# Patient Record
Sex: Male | Born: 1945 | Race: Black or African American | Hispanic: No | Marital: Married
Health system: Southern US, Community
[De-identification: ages and names within clinical notes are randomized; demographics above are authoritative.]

---

## 2009-04-18 ENCOUNTER — Emergency Department: Payer: Self-pay | Admitting: Emergency Medicine

## 2013-07-20 ENCOUNTER — Inpatient Hospital Stay: Payer: Self-pay | Admitting: Internal Medicine

## 2013-07-20 LAB — CBC
HCT: 40.5 % (ref 40.0–52.0)
HGB: 13.7 g/dL (ref 13.0–18.0)
MCH: 32.9 pg (ref 26.0–34.0)
MCHC: 33.7 g/dL (ref 32.0–36.0)
MCV: 98 fL (ref 80–100)
PLATELETS: 437 10*3/uL (ref 150–440)
RBC: 4.15 10*6/uL — ABNORMAL LOW (ref 4.40–5.90)
RDW: 14.6 % — ABNORMAL HIGH (ref 11.5–14.5)
WBC: 13.6 10*3/uL — ABNORMAL HIGH (ref 3.8–10.6)

## 2013-07-20 LAB — BASIC METABOLIC PANEL
ANION GAP: 12 (ref 7–16)
BUN: 13 mg/dL (ref 7–18)
CALCIUM: 8.4 mg/dL — AB (ref 8.5–10.1)
Chloride: 108 mmol/L — ABNORMAL HIGH (ref 98–107)
Co2: 20 mmol/L — ABNORMAL LOW (ref 21–32)
Creatinine: 1.75 mg/dL — ABNORMAL HIGH (ref 0.60–1.30)
EGFR (African American): 46 — ABNORMAL LOW
EGFR (Non-African Amer.): 39 — ABNORMAL LOW
Glucose: 97 mg/dL (ref 65–99)
Osmolality: 279 (ref 275–301)
POTASSIUM: 3.9 mmol/L (ref 3.5–5.1)
SODIUM: 140 mmol/L (ref 136–145)

## 2013-07-20 LAB — PRO B NATRIURETIC PEPTIDE: B-TYPE NATIURETIC PEPTID: 411 pg/mL — AB (ref 0–125)

## 2013-07-20 LAB — TROPONIN I: Troponin-I: <0.02 ng/mL

## 2014-06-20 NOTE — H&P (Signed)
PATIENT NAME:  Jason Mills, Jason Mills MR#:  [id number] DATE OF BIRTH:  Jul 16, 1945  DATE OF ADMISSION:  07/20/2013  REFERRING PHYSICIAN: Dr. Margarita Grizzle.  PRIMARY CARE PHYSICIAN:  nonlocal in [place].   CHIEF COMPLAINT: Short of breath.   HISTORY OF PRESENT ILLNESS: A 69 year old African American gentleman with a past medical history of COPD and hypertension presenting with shortness of breath. Describes acute onset of shortness of breath that occurred about 4 hours prior to arrival at the Emergency Department while he was at a barbecue around a charcoal grill, apparently some of the fumes were agitating his breathing. He subsequently had a cough, followed by shortness of breath, cough, nonproductive. No fevers, chills, or other symptomatology. Denies any chest pain, palpitations, or other symptoms. For shortness of breath, he states that he usually takes an albuterol inhaler; however, he did not have this with him and thus called the EMS and brought to the Emergency Department for further workup and evaluation. On initial arrival, he is saturating 90% on room air as well as noted to be tachypneic. He received 3 breathing treatments. Also placed on supplemental O2 and oxygen saturation has improved. Currently without complaints other than cough.   REVIEW OF SYSTEMS:  CONSTITUTIONAL: Denies fever, chills, fatigue. EYES: Denies blurry, double vision, or eye pain.  HEENT: Denies tinnitus, ear pain, hearing loss.  RESPIRATORY: Positive for cough and shortness of breath as described above. CARDIOVASCULAR: Denies chest pain, palpitations, edema.  GASTROINTESTINAL: Denies nausea, vomiting, diarrhea, or abdominal pain.  GENITOURINARY: Denies dysuria or hematuria.  ENDOCRINE: Denies nocturia or thyroid problems.  HEMATOLOGIC AND LYMPHATIC: Denies easy bruising, bleeding. SKIN: Denies rash or lesions. MUSCULOSKELETAL: Denies pain in neck, back, shoulders, knees, hips, or arthritic symptoms.  NEUROLOGIC: Denies  paralysis or paresthesias.  PSYCHIATRIC: Denies anxiety or depressive symptoms.   Otherwise, full review of systems performed by me is negative.   PAST MEDICAL HISTORY: COPD, hypertension, and BPH.  SOCIAL HISTORY: Positive for tobacco usage as well as occasional alcohol usage. Denies any drug usage.   FAMILY HISTORY: Denies any known cardiovascular or pulmonary problems.   ALLERGIES: No known drug allergies.   HOME MEDICATIONS: He is unsure of the dosing of most of his medications; however, he takes aspirin 81 mg p.o. daily, Flomax 0.4 mg p.o. daily, atorvastatin 1 tab p.o. daily, metoprolol 1 tab p.o. b.i.d., ProAir 90 mcg inhalation 2 puffs 4 times daily as needed for shortness of breath, Spiriva 18 mcg inhalation once daily.   PHYSICAL EXAMINATION:  VITAL SIGNS: Temperature 97.6, heart rate 97, respirations 22, blood pressure 185/99, saturating 89% on room air. Currently saturating 96% on 2 liters supplemental O2. Weight 54.4 kg, BMI 18.3.  GENERAL: Chronically ill-appearing African American gentleman, currently in minimal distress secondary to respiratory status.  HEAD: Normocephalic, atraumatic.  EYES: Pupils equal, round, reactive to light. Extraocular muscles intact. No scleral icterus.  MOUTH: Moist mucous membranes. Dentition intact. No abscess noted.  EARS, NOSE, AND THROAT: Clear without exudates. No external lesions.  NECK: Supple. No thyromegaly. No nodules. No JVD.  PULMONARY: Diminished breath sounds throughout all lung fields. However, no frank wheezes, rales, or rhonchi. No used of accessory muscles. He has good respiratory effort though he is tachypneic at this time.  CHEST:  Nontender on palpation.  CARDIOVASCULAR: S1, S2, regular rate and rhythm. No murmurs, rubs, or gallops. No edema. Pedal pulses 2+ bilaterally.  GASTROINTESTINAL: Soft, nontender, nondistended. No masses. Positive bowel sounds. No hepatosplenomegaly.  MUSCULOSKELETAL: No swelling, clubbing, or  edema.  Range of motion is full in all extremities. NEUROLOGIC: Cranial nerves II-XII intact. No gross neurologic deficits. Sensation intact. Reflexes intact.  SKIN: No ulcerations, lesions, or rashes, or cyanosis. Skin warm and dry. Turgor intact. PSYCHIATRIC: Mood and affect within normal limits. The patient alert and oriented x 3. Insight and judgment intact.   LABORATORY DATA: Chest x-ray performed revealing no acute cardiopulmonary process.   The remainder of laboratory data: His BMP was hemolyzed, so recollection is pending at this time. WBC 13.6, hemoglobin 13.7, platelets clumped and needs to be redrawn.   EKG: Sinus tachycardia.   ASSESSMENT AND PLAN: A 69 year old African American gentleman with a history of chronic obstructive pulmonary disease, presenting with shortness of breath. 1. Chronic obstructive pulmonary disease exacerbation. Provide supplemental oxygen to keep oxygen saturation greater than 90%. DuoNeb therapy q. 4 hours. Solu-Medrol 6 mg IV daily as well as azithromycin.  2. Hypertension. Unknown medications. We will add p.r.n. hydralazine if needed for elevated blood pressure until family returns with actual medication list. 3. Systemic inflammatory response syndrome meeting systemic inflammatory response syndrome criteria by leukocytosis and heart rate on arrival. Most likely secondary to chronic obstructive pulmonary disease exacerbation. Will continue to provide supportive care. No evidence of infection at this time. No indication for antibiotics.  4. Venous thromboembolism prophylaxis with heparin subcutaneous.  CODE STATUS: The patient is a full code.  TIME SPENT: 45 minutes.   ____________________________ Cletis Athensavid K. Hower, MD dkh:lt D: 07/20/2013 22:46:06 ET T: 07/20/2013 23:48:43 ET JOB#: [id number]  cc: Cletis Athensavid K. Hower, MD, <Dictator> DAVID Synetta ShadowK HOWER MD ELECTRONICALLY SIGNED 07/21/2013 20:28

## 2014-06-20 NOTE — Discharge Summary (Signed)
PATIENT NAME:  Jason Mills, Jason Mills MR#:  [id number] DATE OF BIRTH:  1945/06/22  DATE OF ADMISSION:  07/20/2013 DATE OF DISCHARGE:  07/21/2013  DISCHARGE DIAGNOSES:  1. Chronic obstructive pulmonary disease exacerbation.  2. Hypertension.  3. Hyperlipidemia.  4. Benign prostatic hypertrophy .ckd stage3   DISCHARGE MEDICATIONS:  1. Metoprolol 25 mg p.o. b.i.d.  2. Atorvastatin, he does not know the dose.  3. ProAir 90 mcg 2 puffs 4 times daily.  4. Spiriva 18 mcg inhalation daily.  5. Flomax 0.4 mg p.o. daily.  6. Zithromax 500 mg p.o. daily for 3 days.  7. Prednisone 20 mg 2 tablets daily for 2 days, then 1 tablet daily for 2 days and then stop.   CONSULTATIONS: None.   HOSPITAL COURSE: The patient is a 69 year old male patient admitted on the 24th because of trouble breathing. The patient's primary doctor is in [place]. This patient has a history of COPD, uses inhalers on routine basis. He came in because of trouble breathing. According to the history, the patient was at a barbecue at a friend's place and had trouble breathing because of the fumes coming from the grill. At that time, he said he did not have inhalers nearby, and he felt short of breath. So, he did not have any fever, chills and cough, but O2 saturations were found to be 90% on room air as well as he had tachypnea. O2 saturations were 96% on 2 liters and 86% on room air. So, he was admitted for COPD exacerbation. The patient's laboratory data, including chest x-ray, were unremarkable. The patient continued on nebulizers along with steroids and empiric antibiotics. The patient thought to have SIRS because of his COPD attack. The patient felt much better and wanted to go home, so I discharged him home. The patient's O2 saturations also improved, and his O2 saturations are 99% on 2 liters, and we weaned off oxygen. The patient's blood pressure was 167/90 and heart rate 87, temperature 98.6 at the time of discharge. The patient's  lab data showed the patient's chest x-ray was showing no acute cardiopulmonary disease, and WBC was 13.6, hemoglobin 13.7, hematocrit 40.5, platelets 437 on admission. BNP was 411. Electrolytes showed sodium 140, potassium 3.9, chloride 108, bicarbonate 20, BUN 13, creatinine 1.75, glucose 97 on admission. The patient's BNP was 411. On the day of discharge, the patient's physical exam showed lungs were clear, cardiovascular was S1, S2 regular, no murmurs. So, I discharged him home, and I told him that he can follow up with his primary doctor in [place] regarding his kidney function because he may have some chronic kidney disease. He said he will follow up with him. He denied any history of urinary troubles or  kidney problems before. So, the patient advised to follow up with his primary doctor to make sure his kidney function is followed and labs are followed.   TIME SPENT: More than 30 minutes.   ____________________________ Katha HammingSnehalatha Danya Spearman, MD sk:lb D: 07/22/2013 10:18:23 ET T: 07/22/2013 10:48:04 ET JOB#: [id number]  cc: Katha HammingSnehalatha Imraan Wendell, MD, <Dictator> Katha HammingSNEHALATHA Tyriana Helmkamp MD ELECTRONICALLY SIGNED 07/23/2013 21:17

## 2015-09-16 IMAGING — CR DG CHEST 1V PORT
1 series · 1 of 1 positions shown · non-contrast
Comparison: None.

CLINICAL DATA: Shortness of breath.

EXAM:
PORTABLE CHEST - 1 VIEW

[ap]
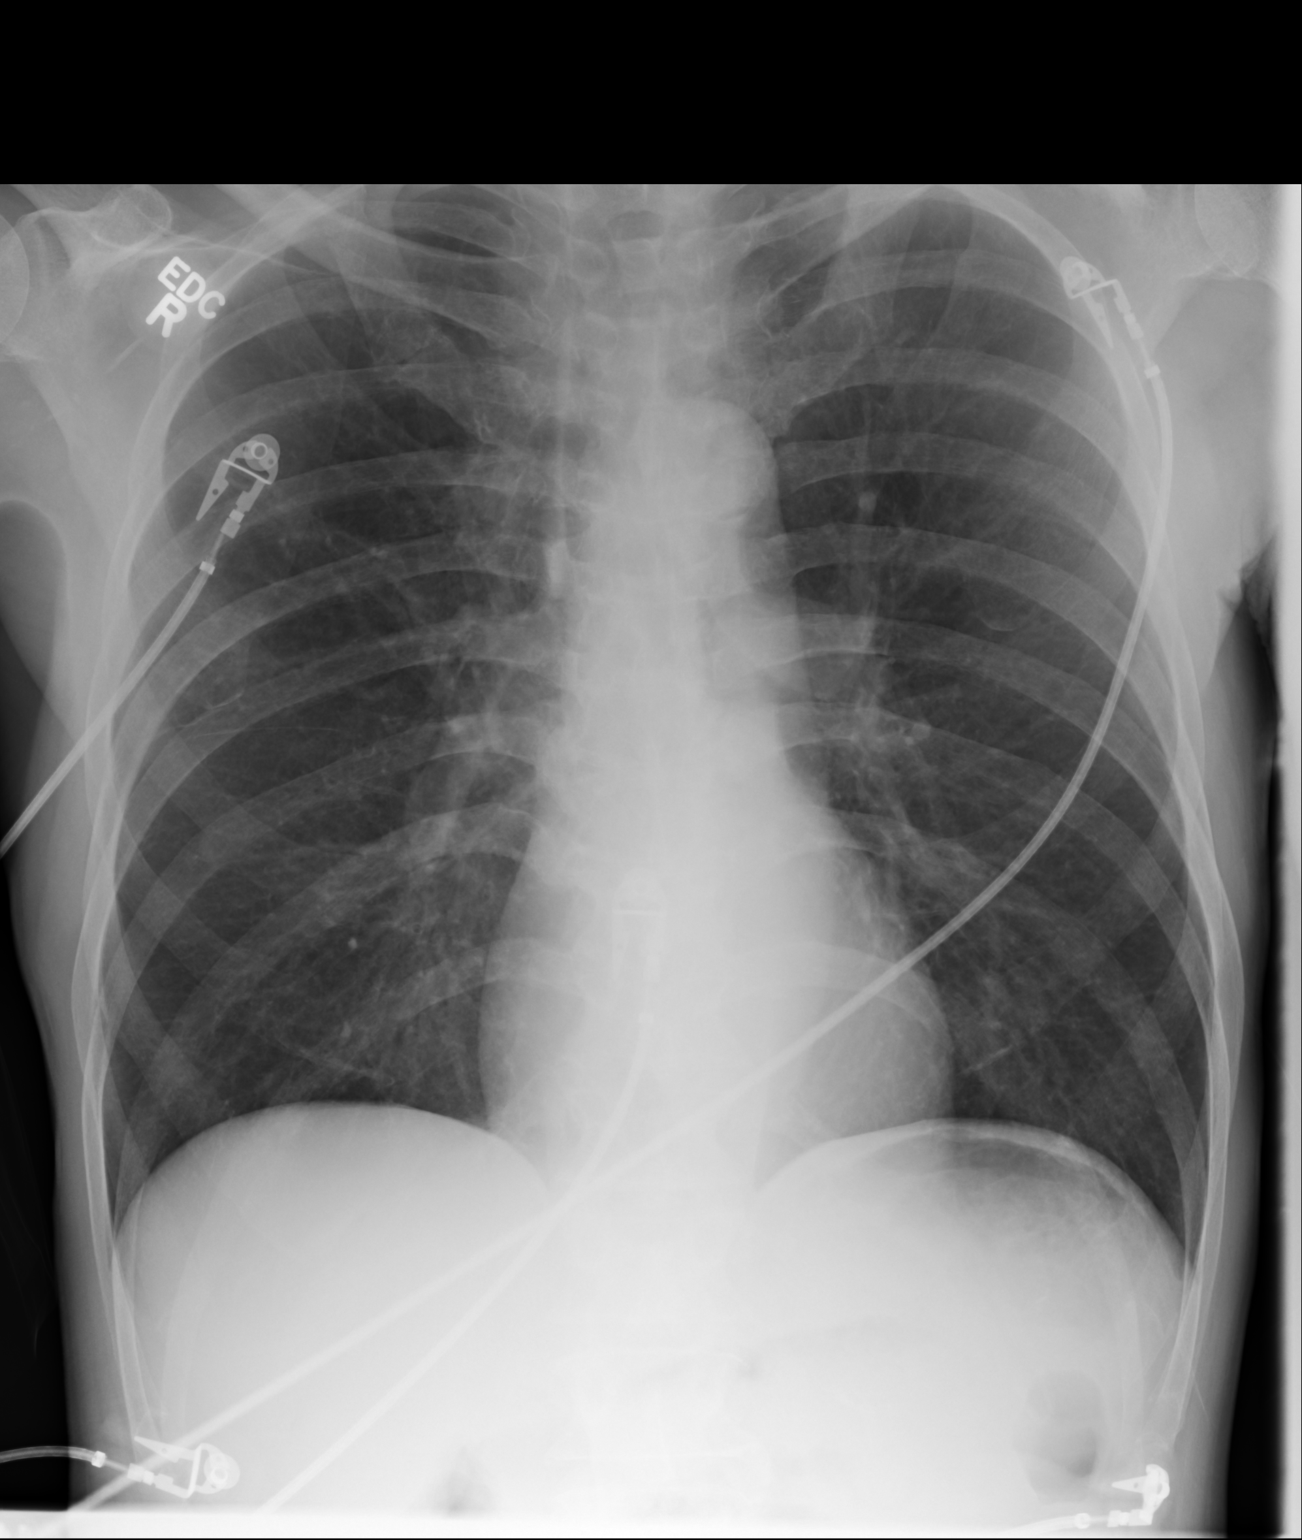

[1 of 1 positions shown; findings below may reference images not displayed]

FINDINGS: The lungs are well-aerated and clear. There is no evidence of focal
opacification, pleural effusion or pneumothorax.

The cardiomediastinal silhouette is within normal limits. No acute
osseous abnormalities are seen.
IMPRESSION: No acute cardiopulmonary process seen.

## 2016-07-28 DEATH — deceased
